# Patient Record
Sex: Female | Born: 1995 | Race: White | Hispanic: No | Marital: Single | Smoking: Never smoker
Health system: Southern US, Community
[De-identification: ages and names within clinical notes are randomized; demographics above are authoritative.]

## PROBLEM LIST (undated history)

## (undated) DIAGNOSIS — F419 Anxiety disorder, unspecified: Secondary | ICD-10-CM

## (undated) DIAGNOSIS — R569 Unspecified convulsions: Secondary | ICD-10-CM

---
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
## 2017-01-14 ENCOUNTER — Encounter: Payer: Self-pay | Admitting: Emergency Medicine

## 2017-01-14 ENCOUNTER — Emergency Department
Admission: EM | Admit: 2017-01-14 | Discharge: 2017-01-15 | Disposition: A | Discharge status: Other Acute Inpatient Hospital | Payer: Medicaid Other | Attending: Emergency Medicine | Admitting: Emergency Medicine

## 2017-01-14 DIAGNOSIS — X838XXA Intentional self-harm by other specified means, initial encounter: Secondary | ICD-10-CM | POA: Diagnosis not present

## 2017-01-14 DIAGNOSIS — Y999 Unspecified external cause status: Secondary | ICD-10-CM | POA: Insufficient documentation

## 2017-01-14 DIAGNOSIS — J45909 Unspecified asthma, uncomplicated: Secondary | ICD-10-CM | POA: Insufficient documentation

## 2017-01-14 DIAGNOSIS — F332 Major depressive disorder, recurrent severe without psychotic features: Secondary | ICD-10-CM | POA: Diagnosis not present

## 2017-01-14 DIAGNOSIS — Y929 Unspecified place or not applicable: Secondary | ICD-10-CM | POA: Insufficient documentation

## 2017-01-14 DIAGNOSIS — T604X2A Toxic effect of rodenticides, intentional self-harm, initial encounter: Secondary | ICD-10-CM | POA: Diagnosis present

## 2017-01-14 DIAGNOSIS — Y939 Activity, unspecified: Secondary | ICD-10-CM | POA: Insufficient documentation

## 2017-01-14 DIAGNOSIS — T1491XA Suicide attempt, initial encounter: Secondary | ICD-10-CM | POA: Diagnosis not present

## 2017-01-14 DIAGNOSIS — Z79899 Other long term (current) drug therapy: Secondary | ICD-10-CM | POA: Insufficient documentation

## 2017-01-14 DIAGNOSIS — F3160 Bipolar disorder, current episode mixed, unspecified: Secondary | ICD-10-CM | POA: Diagnosis not present

## 2017-01-14 DIAGNOSIS — F319 Bipolar disorder, unspecified: Secondary | ICD-10-CM

## 2017-01-14 HISTORY — DX: Unspecified convulsions: R56.9

## 2017-01-14 LAB — ACETAMINOPHEN LEVEL

## 2017-01-14 LAB — URINE DRUG SCREEN, QUALITATIVE (ARMC ONLY)
Amphetamines, Ur Screen: NOT DETECTED
BENZODIAZEPINE, UR SCRN: POSITIVE — AB
Barbiturates, Ur Screen: NOT DETECTED
CANNABINOID 50 NG, UR ~~LOC~~: POSITIVE — AB
Cocaine Metabolite,Ur ~~LOC~~: NOT DETECTED
MDMA (Ecstasy)Ur Screen: NOT DETECTED
Methadone Scn, Ur: NOT DETECTED
Opiate, Ur Screen: POSITIVE — AB
PHENCYCLIDINE (PCP) UR S: NOT DETECTED
Tricyclic, Ur Screen: NOT DETECTED

## 2017-01-14 LAB — PROTIME-INR
INR: 1
Prothrombin Time: 13.1 seconds (ref 11.4–15.2)

## 2017-01-14 LAB — COMPREHENSIVE METABOLIC PANEL
ALK PHOS: 51 U/L (ref 38–126)
ALT: 12 U/L — ABNORMAL LOW (ref 14–54)
AST: 20 U/L (ref 15–41)
Albumin: 4.4 g/dL (ref 3.5–5.0)
Anion gap: 10 (ref 5–15)
BILIRUBIN TOTAL: 0.4 mg/dL (ref 0.3–1.2)
BUN: 17 mg/dL (ref 6–20)
CO2: 20 mmol/L — ABNORMAL LOW (ref 22–32)
Calcium: 9.1 mg/dL (ref 8.9–10.3)
Chloride: 113 mmol/L — ABNORMAL HIGH (ref 101–111)
Creatinine, Ser: 0.82 mg/dL (ref 0.44–1.00)
GFR calc non Af Amer: >60 mL/min (ref >60)
Glucose, Bld: 92 mg/dL (ref 65–99)
Potassium: 3.5 mmol/L (ref 3.5–5.1)
Sodium: 143 mmol/L (ref 135–145)
TOTAL PROTEIN: 7.5 g/dL (ref 6.5–8.1)

## 2017-01-14 LAB — CBC WITH DIFFERENTIAL/PLATELET
BASOS ABS: 0.1 10*3/uL (ref 0–0.1)
Basophils Relative: 1 %
EOS ABS: 0.1 10*3/uL (ref 0–0.7)
EOS PCT: 2 %
HCT: 42.5 % (ref 35.0–47.0)
Hemoglobin: 14.6 g/dL (ref 12.0–16.0)
Lymphocytes Relative: 38 %
Lymphs Abs: 2.4 10*3/uL (ref 1.0–3.6)
MCH: 31.5 pg (ref 26.0–34.0)
MCHC: 34.5 g/dL (ref 32.0–36.0)
MCV: 91.3 fL (ref 80.0–100.0)
Monocytes Absolute: 0.3 10*3/uL (ref 0.2–0.9)
Monocytes Relative: 5 %
Neutro Abs: 3.4 10*3/uL (ref 1.4–6.5)
Neutrophils Relative %: 54 %
PLATELETS: 319 10*3/uL (ref 150–440)
RBC: 4.65 MIL/uL (ref 3.80–5.20)
RDW: 13.5 % (ref 11.5–14.5)
WBC: 6.3 10*3/uL (ref 3.6–11.0)

## 2017-01-14 LAB — HCG, QUANTITATIVE, PREGNANCY

## 2017-01-14 LAB — SALICYLATE LEVEL

## 2017-01-14 LAB — ETHANOL: ALCOHOL ETHYL (B): 276 mg/dL — AB (ref <10)

## 2017-01-14 MED ORDER — LORAZEPAM 2 MG/ML IJ SOLN
1.0000 mg | Freq: Once | INTRAMUSCULAR | Status: AC
  Administered 2017-01-14: 1 mg via INTRAVENOUS

## 2017-01-14 MED ORDER — HALOPERIDOL LACTATE 5 MG/ML IJ SOLN
5.0000 mg | Freq: Once | INTRAMUSCULAR | Status: AC
Start: 2017-01-14 — End: 2017-01-14
  Administered 2017-01-14: 5 mg via INTRAVENOUS

## 2017-01-14 NOTE — ED Provider Notes (Addendum)
[facility] Emergency Department Provider Note ____________________________________________   First MD Initiated Contact with Patient 01/14/17 1931     (approximate)  I have reviewed the triage vital signs and the nursing notes.   HISTORY  Chief Complaint Ingestion    HPI Lindsey Casey is a 21 y.o. female with a past medical history of asthma, seizure d/o and anxiety who presents with an apparent suicidal attempt - patient states she has Casey getting increasingly sad over the last few days.  She states she went to her church where both her father and grandfather (who recently died) prayed, and then she took a mouse repellant mixed in water in order to try to kill herself.  Patient states she does not want to hurt herself right now.  She reports drinking alcohol but denies any other co-ingestants.  She denies any pain, injury or other acute symptoms currently.   Past Medical History:  Diagnosis Date  . Anxiety   . Asthma   . Seizures [facility])     Patient Active Problem List   Diagnosis Date Noted  . Leukocytosis 02/03/2016  . Seizure (HCC) 02/03/2016  . Anxiety 02/03/2016  . Asthma 02/03/2016  . 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 No pertinent surgical history.  Prior to Admission medications   Medication Sig Start Date End Date Taking? Authorizing Provider  levETIRAcetam (KEPPRA) 500 MG tablet Take 1 tablet (500 mg total) by mouth 2 (two) times daily. 02/05/16   Marinda Elk, MD  loratadine (CLARITIN) 10 MG tablet Take 10 mg by mouth daily.    [provider]    Allergies Other and Focalin [dexmethylphenidate hydrochloride]  History reviewed. No pertinent family history.  Social History Social History  Substance Use Topics  . Smoking status: Never Smoker  . Smokeless tobacco: Never Used  . Alcohol use No    Review of Systems  Constitutional: No fever. Eyes: No redness. ENT:  No sore throat. Cardiovascular: Denies chest pain. Respiratory: Denies shortness of breath. Gastrointestinal: No nausea, no vomiting.   Genitourinary: Negative for flank pain.  Musculoskeletal: Negative for back pain. Skin: Negative for rash. Neurological: Negative for headache.   ____________________________________________   PHYSICAL EXAM:  VITAL SIGNS: ED Triage Vitals  Enc Vitals Group     BP 01/14/17 1927 102/87     Pulse Rate 01/14/17 1927 (!) 120     Resp 01/14/17 1927 17     Temp --      Temp src --      SpO2 01/14/17 1927 97 %     Weight 01/14/17 1937 120 lb (54.4 kg)     Height 01/14/17 1937 5\' 4"  (1.626 m)     Head Circumference --      Peak Flow --      Pain Score --      Pain Loc --      Pain Edu? --      Excl. in GC? --     Constitutional: Alert and oriented. Tearful.  Relatively comfortable appearing and in no acute distress. Eyes: Conjunctivae are normal.  EOMI.  PERRLA.  Head: Atraumatic. Nose: No congestion/rhinnorhea. Mouth/Throat: Mucous membranes are moist.   Neck: Normal range of motion.  Cardiovascular: Tachycardic, regular rhythm. Grossly normal heart sounds.  Good peripheral circulation. Respiratory: Normal respiratory effort.  No retractions. Lungs CTAB. Gastrointestinal: Soft and nontender. No distention.  Genitourinary: No flank tenderness. Musculoskeletal: No lower extremity edema.  Extremities warm and  well perfused.  Neurologic:  Normal speech and language.  Motor intact in all extremities.  No gross focal neurologic deficits are appreciated.  Skin:  Skin is warm and dry. No rash noted. Psychiatric: Depressed mood. Speech and behavior are normal.  ____________________________________________   LABS (all labs ordered are listed, but only abnormal results are displayed)  Labs Reviewed  COMPREHENSIVE METABOLIC PANEL - Abnormal; Notable for the following:       Result Value   Chloride 113 (*)    CO2 20 (*)    ALT 12 (*)    All  other components within normal limits  ACETAMINOPHEN LEVEL - Abnormal; Notable for the following:    Acetaminophen (Tylenol), Serum <10 (*)    All other components within normal limits  ETHANOL - Abnormal; Notable for the following:    Alcohol, Ethyl (B) 276 (*)    All other components within normal limits  URINE DRUG SCREEN, QUALITATIVE ([facility] ONLY) - Abnormal; Notable for the following:    Opiate, Ur Screen POSITIVE (*)    Cannabinoid 50 Ng, Ur [place] POSITIVE (*)    Benzodiazepine, Ur Scrn POSITIVE (*)    All other components within normal limits  SALICYLATE LEVEL  CBC WITH DIFFERENTIAL/PLATELET  HCG, QUANTITATIVE, PREGNANCY  PROTIME-INR  POC URINE PREG, ED  CBG MONITORING, ED   ____________________________________________  EKG  ED ECG REPORT I, Dionne BucySebastian Reagyn Facemire, the attending physician, personally viewed and interpreted this ECG.  Date: 01/14/2017 EKG Time: 1934 Rate: 107 Rhythm: sinus tachycardia QRS Axis: normal Intervals: normal ST/T Wave abnormalities: normal Narrative Interpretation: no evidence of acute ischemia  ____________________________________________  RADIOLOGY    ____________________________________________   PROCEDURES  Procedure(s) performed: No    Critical Care performed: No ____________________________________________   INITIAL IMPRESSION / ASSESSMENT AND PLAN / ED COURSE  Pertinent labs & imaging results that were available during my care of the patient were reviewed by me and considered in my medical decision making (see chart for details).  21 year old female with past medical history as noted presents with apparent suicide/self harm attempt.  Patient is tachycardic at triage but other VS normal, and exam otherwise unremarkable.  No trauma.  Patient is now calm and cooperative.    I discussed case with Poison Control - the mouse repellant patient ingested has the following active ingredients: mint oil, rosemary oil, and cedar oil.  It  has the following inactive ingredients: ca carbonate, hydrogenated soybean oil.  Poison Control recs are supportive care and evaluation for coingestants- no specific intervention indicated.  Plan for tox lab workup, [facility], and reassess.  Patient involuntarily committed due to acute danger to self and others.  ----------------------------------------- 9:05 PM on 01/14/2017 -----------------------------------------  Patient ran out of the room and attempted to escape the ED.  She was restrained by staff, and then began attempting to hit and kick staff members.  Patient was brought to the ground and was not injured.  We were unable to verbally redirect; due to acute danger to self and others, patient sedated and placed in 4-point restraints pending effects of the sedation.  Will continue to monitor closely.  Restraints will be reassessed and removed as soon as it is safe.    ----------------------------------------- 8:21 AM on 01/20/2017 -----------------------------------------   Behavioral Restraint Provider Note:  Behavioral Indicators: Danger to self and others; violent behavior.   Reaction to intervention: Resisting  Review of systems: No changes  History: H&P and lab results reviewed.   Mental Status Exam: Alert, agitated.  Restraint Continuation: Continue until patient adequately sedated.   Restraint Rationale Continuation: Acute danger to self and others.      ----------------------------------------- 11:37 PM on 01/14/2017 -----------------------------------------  Patient sleeping comfortably.  Signed out to Dr. Manson Passey.  When awake, [facility] consult and dispo per [facility] recs.  Medical workup is negative.    ____________________________________________   FINAL CLINICAL IMPRESSION(S) / ED DIAGNOSES  Final diagnoses:  Suicidal behavior with attempted self-injury (HCC)      NEW MEDICATIONS STARTED DURING THIS VISIT:  New Prescriptions   No medications on  file     Note:  This document was prepared using Dragon voice recognition software and may include unintentional dictation errors.   Dionne Bucy, MD 01/14/17 [id number]    Dionne Bucy, MD 01/20/17 [phone]

## 2017-01-14 NOTE — ED Triage Notes (Signed)
Pt bib [facility] for suicide attempt by eating organic rat poison and drinking alcohol. Pt states recent life changes furthering depression, not on medications at this time d/t lack of insurance. Pt states taking 2 teaspoons of rat poison and drinking 2 twisted teas, 1 glass of wine, and 1 [place] iced tea.   Pt states father was killed by police approx 1 year ago, birthday was [birth year] 31, the same day pt found boyfriend cheating. [place] [employer] [place] and that is when attempted to kill herself to visit father.    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 Not taking meds at this time.

## 2017-01-14 NOTE — ED Notes (Signed)
Patient IVC/SOC has been called/Awaiting telepsych consult.

## 2017-01-14 NOTE — ED Notes (Addendum)
Pt black jeans, maroon underwear, pink bra, black floral shirt and black shoes placed in belongings bag placed at nurses station by this RN. Also 2 black hair ties and 1 purple/brown bracelet.   Pt dressed out into behavioral clothes by this RN and Mayra, EDT

## 2017-01-14 NOTE — BH Assessment (Signed)
Assessment Note  [name] is an 21 y.o. female. The pt came in after overdosing on rat poison.  She reported she was thinking about her deceased father, who passed away October 29 when the pt was 13.  She stated she also found out her bf was cheating on her October 29 this year.  The pt had multiple drinks earlier today.  She stated she had 2 glasses of wine, a [place] iced tea.  She mixed the rat poison with 2 malt beverages.  Her blood alcohol level was 276.  She stated the desire to kill herself is something she started to think about today after she started drinking.  She states she does not want to die now.  The patient had another suicide attempt in July when she jumped off of a bridge.  She was hospitalized at [facility] in July.  [place] [facility] [place].  She reported due to a lack of finances, she has not taken her medication for the last 3 weeks.  She stated she still had a good appetite, but her sleep is erratic, with her sleeping to much at times and not enough at other times.  The pt reported she has a history of using cocaine, marijuana, and xanax.  She stated she uses marijuana and xanax daily.  She stated she uses cocaine occasionally and has not used it in 2 weeks.  She reported she uses alcohol only on special occasions.  The pt UDS was positive for marijuana, benzodiazepine (xxanax) and opiate.  The pt denied abusing pain pills or using heroin.  She denies HI and psychosis.   Diagnosis: Major Depressive Disorder, Severe  Past Medical History:  Past Medical History:  Diagnosis Date  . Anxiety   . Asthma   . Seizures (HCC)     History reviewed. No pertinent surgical history.  Family History: History reviewed. No pertinent family history.  Social History:  reports that she has never smoked. She has never used smokeless tobacco. She reports that she uses drugs, including Marijuana, about 3 times per week. She reports that she does not drink  alcohol.  Additional Social History:  Alcohol / Drug Use Pain Medications: See PTA Prescriptions: See PTA Over the Counter: See PTA History of alcohol / drug use?: Yes Substance #1 Name of Substance 1: marijuanna 1 - Age of First Use: 13 1 - Frequency: daily 1 - Last Use / Amount: 01/13/2017 Substance #2 Name of Substance 2: cocaine 2 - Age of First Use: "can't remember" 2 - Frequency: once a month 2 - Duration: "can't remember" 2 - Last Use / Amount: 2 weeks ago Substance #3 Name of Substance 3: alcohol 3 - Frequency: "special occassions" 3 - Last Use / Amount: today Substance #4 Name of Substance 4: Xanax 4 - Amount (size/oz): "one pill" 4 - Last Use / Amount: last week  CIWA: CIWA-Ar BP: 102/87 Pulse Rate: (!) 120 COWS:    Allergies:  Allergies  Allergen Reactions  . Other     Heat - leads to hives  . Focalin [Dexmethylphenidate Hydrochloride] Rash    Major rash with blistering    Home Medications:  (Not in a hospital admission)  OB/GYN Status:  Patient's last menstrual period was 01/10/2017.  General Assessment Data Location of Assessment[facility] ED TTS Assessment: In system Is this a Tele or Face-to-Face Assessment?: Face-to-Face Is this an Initial Assessment or a Re-assessment for this encounter?: Initial Assessment Marital status: Single  Maiden name: NA Is patient pregnant?: No Pregnancy Status: No Living Arrangements: Parent Can pt return to current living arrangement?: Yes Admission Status: Involuntary Is patient capable of signing voluntary admission?: No Referral Source: Self/Family/Friend Insurance type:  (none)     Crisis Care Plan Living Arrangements: Parent Name of Psychiatrist: [facility] Psychiatrist Name of Therapist: [facility] counselor  Education Status Is patient currently in school?: No Current Grade: NA Highest grade of school patient has completed: high school diploma Name of school: NA Contact person: NA  Risk to self with the past  6 months Suicidal Ideation: Yes-Currently Present Has patient been a risk to self within the past 6 months prior to admission? : Yes Suicidal Intent: Yes-Currently Present Has patient had any suicidal intent within the past 6 months prior to admission? : Yes Is patient at risk for suicide?: Yes Suicidal Plan?: No-Not Currently/Within Last 6 Months Has patient had any suicidal plan within the past 6 months prior to admission? : Yes Access to Means: Yes Specify Access to Suicidal Means: took rat poison What has been your use of drugs/alcohol within the last 12 months?: had alcohol today, uses cocaine, marijuana and xanax Previous Attempts/Gestures: Yes How many times?: 2 Other Self Harm Risks: none Triggers for Past Attempts: Anniversary, Other (Comment) (break up from BF and court dates) Intentional Self Injurious Behavior: None Family Suicide History: Unknown Recent stressful life event(s): Conflict (Comment), Loss (Comment) (conflict with bf and anniversary of father's death.) Persecutory voices/beliefs?: No Depression: Yes Depression Symptoms: Feeling worthless/self pity Substance abuse history and/or treatment for substance abuse?: Yes Suicide prevention information given to non-admitted patients: Not applicable  Risk to Others within the past 6 months Homicidal Ideation: No Does patient have any lifetime risk of violence toward others beyond the six months prior to admission? : No Thoughts of Harm to Others: No Current Homicidal Intent: No Current Homicidal Plan: No Access to Homicidal Means: No Identified Victim: NA History of harm to others?: No Assessment of Violence: None Noted Violent Behavior Description: none Does patient have access to weapons?: No Criminal Charges Pending?: No Does patient have a court date: No Is patient on probation?: No  Psychosis Hallucinations: None noted Delusions: None noted  Mental Status Report Appearance/Hygiene: In scrubs,  Unremarkable Eye Contact: Good Motor Activity: Unremarkable, Freedom of movement Speech: Logical/coherent Level of Consciousness: Alert Mood: Depressed Affect: Appropriate to circumstance Anxiety Level: Minimal Thought Processes: Coherent, Relevant Judgement: Impaired Orientation: Person, Time, Place, Situation, Appropriate for developmental age Obsessive Compulsive Thoughts/Behaviors: None  Cognitive Functioning Concentration: Decreased Memory: Recent Intact, Remote Intact IQ: Average Insight: Poor Impulse Control: Poor Appetite: Good Weight Loss: 0 Weight Gain: 0 Sleep: Increased Total Hours of Sleep: 10 Vegetative Symptoms: None  ADLScreening [facility] Assessment Services) Patient's cognitive ability adequate to safely complete daily activities?: Yes Patient able to express need for assistance with ADLs?: Yes Independently performs ADLs?: Yes (appropriate for developmental age)  Prior Inpatient Therapy Prior Inpatient Therapy: Yes Prior Therapy Dates: multiple most recent summer 2018 Prior Therapy Facilty/Provider(s): [place], [place], and [place] Reason for Treatment: SI  Prior Outpatient Therapy Prior Outpatient Therapy: Yes Prior Therapy Dates: current Prior Therapy Facilty/Provider(s): [facility] Reason for Treatment: depression Does patient have an ACCT team?: No Does patient have Intensive In-House Services?  : No Does patient have [facility] services? : No Does patient have P4CC services?: No  ADL Screening (condition at time of admission) Patient's cognitive ability adequate to safely complete daily activities?: Yes Is the patient deaf or have  difficulty hearing?: No Does the patient have difficulty seeing, even when wearing glasses/contacts?: No Does the patient have difficulty concentrating, remembering, or making decisions?: No Patient able to express need for assistance with ADLs?: Yes Does the patient have difficulty dressing or bathing?: No Independently  performs ADLs?: Yes (appropriate for developmental age) Does the patient have difficulty walking or climbing stairs?: No Weakness of Legs: None Weakness of Arms/Hands: None  Home Assistive Devices/Equipment Home Assistive Devices/Equipment: None  Therapy Consults (therapy consults require a physician order) PT Evaluation Needed: No OT Evalulation Needed: No SLP Evaluation Needed: No Abuse/Neglect Assessment (Assessment to be complete while patient is alone) Physical Abuse: Denies Verbal Abuse: Denies Sexual Abuse: Denies Exploitation of patient/patient's resources: Denies Self-Neglect: Denies Values / Beliefs Cultural Requests During Hospitalization: None Spiritual Requests During Hospitalization: None Consults Spiritual Care Consult Needed: No Social Work Consult Needed: No Merchant navy officer (For Healthcare) Does Patient Have a Medical Advance Directive?: No    Additional Information 1:1 In Past 12 Months?: No CIRT Risk: No Elopement Risk: No Does patient have medical clearance?: Yes     Disposition:  Disposition Initial Assessment Completed for this Encounter: Yes Disposition of Patient: Referred to [facility])  On Site Evaluation by:   Reviewed with Physician:    Ottis Stain 01/14/2017 8:47 PM
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
## 2017-01-15 ENCOUNTER — Encounter: Payer: Self-pay | Admitting: Psychiatry

## 2017-01-15 ENCOUNTER — Inpatient Hospital Stay
Admission: AD | Admit: 2017-01-15 | Discharge: 2017-01-21 | DRG: 885 | Disposition: A | Discharge status: Home / Self Care | Payer: No Typology Code available for payment source | Attending: Psychiatry | Admitting: Psychiatry

## 2017-01-15 DIAGNOSIS — R569 Unspecified convulsions: Secondary | ICD-10-CM | POA: Diagnosis present

## 2017-01-15 DIAGNOSIS — Y908 Blood alcohol level of 240 mg/100 ml or more: Secondary | ICD-10-CM | POA: Diagnosis present

## 2017-01-15 DIAGNOSIS — Z888 Allergy status to other drugs, medicaments and biological substances status: Secondary | ICD-10-CM | POA: Diagnosis not present

## 2017-01-15 DIAGNOSIS — Z79899 Other long term (current) drug therapy: Secondary | ICD-10-CM

## 2017-01-15 DIAGNOSIS — F39 Unspecified mood [affective] disorder: Principal | ICD-10-CM

## 2017-01-15 DIAGNOSIS — Z23 Encounter for immunization: Secondary | ICD-10-CM | POA: Diagnosis not present

## 2017-01-15 DIAGNOSIS — F419 Anxiety disorder, unspecified: Secondary | ICD-10-CM | POA: Diagnosis present

## 2017-01-15 DIAGNOSIS — F603 Borderline personality disorder: Secondary | ICD-10-CM | POA: Diagnosis present

## 2017-01-15 DIAGNOSIS — F319 Bipolar disorder, unspecified: Secondary | ICD-10-CM

## 2017-01-15 DIAGNOSIS — T604X2A Toxic effect of rodenticides, intentional self-harm, initial encounter: Secondary | ICD-10-CM | POA: Diagnosis not present

## 2017-01-15 DIAGNOSIS — F1010 Alcohol abuse, uncomplicated: Secondary | ICD-10-CM | POA: Diagnosis present

## 2017-01-15 DIAGNOSIS — Z915 Personal history of self-harm: Secondary | ICD-10-CM

## 2017-01-15 DIAGNOSIS — F1310 Sedative, hypnotic or anxiolytic abuse, uncomplicated: Secondary | ICD-10-CM | POA: Diagnosis present

## 2017-01-15 DIAGNOSIS — F3160 Bipolar disorder, current episode mixed, unspecified: Secondary | ICD-10-CM | POA: Diagnosis not present

## 2017-01-15 LAB — RAPID HIV SCREEN (HIV 1/2 AB+AG)
HIV 1/2 ANTIBODIES: NONREACTIVE
HIV-1 P24 Antigen - HIV24: NONREACTIVE

## 2017-01-15 MED ORDER — MAGNESIUM HYDROXIDE 400 MG/5ML PO SUSP: 30.0000 mL | Freq: Every day | ORAL | Status: DC | PRN

## 2017-01-15 MED ORDER — HALOPERIDOL 5 MG PO TABS
5.0000 mg | ORAL_TABLET | Freq: Four times a day (QID) | ORAL | Status: DC | PRN
Start: 2017-01-15 — End: 2017-01-21
  Administered 2017-01-19: 5 mg via ORAL
  Filled 2017-01-15: qty 1

## 2017-01-15 MED ORDER — LEVETIRACETAM 500 MG PO TABS
500.0000 mg | ORAL_TABLET | Freq: Two times a day (BID) | ORAL | Status: DC
  Administered 2017-01-15 – 2017-01-21 (×12): 500 mg via ORAL
  Filled 2017-01-15 (×12): qty 1

## 2017-01-15 MED ORDER — ALUM & MAG HYDROXIDE-SIMETH 200-200-20 MG/5ML PO SUSP: 30.0000 mL | ORAL | Status: DC | PRN

## 2017-01-15 MED ORDER — LITHIUM CARBONATE ER 300 MG PO TBCR
300.0000 mg | EXTENDED_RELEASE_TABLET | Freq: Two times a day (BID) | ORAL | Status: DC
  Administered 2017-01-15 – 2017-01-21 (×12): 300 mg via ORAL
  Filled 2017-01-15 (×13): qty 1

## 2017-01-15 MED ORDER — LOPERAMIDE HCL 2 MG PO CAPS: 2.0000 mg | ORAL_CAPSULE | ORAL | Status: AC | PRN

## 2017-01-15 MED ORDER — HYDROXYZINE HCL 25 MG PO TABS
25.0000 mg | ORAL_TABLET | Freq: Four times a day (QID) | ORAL | Status: AC | PRN
Start: 2017-01-15 — End: 2017-01-18
  Administered 2017-01-17: 25 mg via ORAL
  Filled 2017-01-15: qty 1

## 2017-01-15 MED ORDER — HALOPERIDOL LACTATE 5 MG/ML IJ SOLN: 5.0000 mg | Freq: Four times a day (QID) | INTRAMUSCULAR | Status: DC | PRN

## 2017-01-15 MED ORDER — ACETAMINOPHEN 325 MG PO TABS
650.0000 mg | ORAL_TABLET | Freq: Four times a day (QID) | ORAL | Status: DC | PRN
  Administered 2017-01-19: 650 mg via ORAL
  Filled 2017-01-15: qty 2

## 2017-01-15 MED ORDER — ONDANSETRON 4 MG PO TBDP: 4.0000 mg | ORAL_TABLET | Freq: Four times a day (QID) | ORAL | Status: AC | PRN

## 2017-01-15 MED ORDER — GABAPENTIN 600 MG PO TABS
600.0000 mg | ORAL_TABLET | Freq: Three times a day (TID) | ORAL | Status: DC
  Administered 2017-01-16 – 2017-01-20 (×13): 600 mg via ORAL
  Filled 2017-01-15 (×13): qty 1

## 2017-01-15 MED ORDER — CHLORDIAZEPOXIDE HCL 25 MG PO CAPS
25.0000 mg | ORAL_CAPSULE | Freq: Four times a day (QID) | ORAL | Status: AC | PRN
  Administered 2017-01-15: 25 mg via ORAL
  Filled 2017-01-15: qty 1

## 2017-01-15 MED ORDER — LEVETIRACETAM 500 MG PO TABS
500.0000 mg | ORAL_TABLET | Freq: Two times a day (BID) | ORAL | Status: DC
  Administered 2017-01-15: 500 mg via ORAL
  Filled 2017-01-15 (×3): qty 1

## 2017-01-15 MED ORDER — HYDROXYZINE HCL 25 MG PO TABS: 25.0000 mg | ORAL_TABLET | Freq: Four times a day (QID) | ORAL | Status: DC | PRN

## 2017-01-15 MED ORDER — GABAPENTIN 600 MG PO TABS
600.0000 mg | ORAL_TABLET | Freq: Three times a day (TID) | ORAL | Status: DC
  Administered 2017-01-15: 600 mg via ORAL
  Filled 2017-01-15: qty 1

## 2017-01-15 MED ORDER — CHLORDIAZEPOXIDE HCL 25 MG PO CAPS: 25.0000 mg | ORAL_CAPSULE | Freq: Four times a day (QID) | ORAL | Status: DC | PRN

## 2017-01-15 NOTE — ED Notes (Addendum)
This RN escort pt mom to room for visit. 1:1 remain in pt room with this RN, pt, and pt mother during visit. Pt began talking about money and not affording this visit to ER and began yelling at mother regarding finances. Pt screaming at mother to "GET THE FUCK OUT". Mother told pt she loved her and this RN walked mother back out to the lobby with the waiting family. 1:1 sitter remained in room with patient.

## 2017-01-15 NOTE — ED Notes (Signed)
Pt sleeping on stretcher.  Given several cups of water.  Pt is very cooperative, states she is very embarrassed of her behavior earlier and agrees to be cooperative until discharge
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
## 2017-01-15 NOTE — ED Notes (Signed)
Pt transferred over via tech and Emergency planning/management officerpolice officer. Pt was in paper scrubs. Pt was wanded. Pt showed rules and regulation of unit. Pt offer fluids. Pt given an extra blanket. Pt has fake blood up under her chin and neck. Pt is on Q15 mins safety rounds. Will cont to monitor pt.

## 2017-01-15 NOTE — ED Notes (Signed)
Patient observed lying in bed with eyes closed  Even, unlabored respirations observed   NAD pt appears to be sleeping  I will continue to monitor along with every 15 minute visual observations and ongoing security monitoring    

## 2017-01-15 NOTE — ED Notes (Signed)

## 2017-01-15 NOTE — ED Notes (Signed)
Sisters number [phone]

## 2017-01-15 NOTE — BH Assessment (Signed)
Pt to be admitted to BMU per Dr.McNew. Pt pending bed assignment.
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
## 2017-01-15 NOTE — BH Assessment (Addendum)
[facility] by Dr. Mikey CollegeMcKnew.  Attending Physician will be Dr. Mikey CollegeMcKnew.   Patient has been assigned to room 313, [facility] Charge Nurse SidneyPhyllis.   Intake Paper Work has been signed and placed on patient chart.  ER staff is aware of the admission (Dr. Lenard LancePaduchowski, ER MD; Vic RipperAmy B., Patient's Nurse & Mertie ClauseJeanelle, Patient Access).

## 2017-01-15 NOTE — BH Assessment (Signed)
Dr.Kumar requests SOC be discontinued and psych consult with Dr.Clapacs be entered. EDP Dr.Norman informed.

## 2017-01-15 NOTE — ED Notes (Signed)
Pt awakened  - vital sign taken - pt educated [facility]  - psych consult pending   IV removed  Breakfast provided

## 2017-01-15 NOTE — ED Notes (Signed)
Serum glucose 92 upon arrival
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
## 2017-01-15 NOTE — ED Notes (Signed)
[facility] Pt available for transfer after 8 pm.
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
## 2017-01-15 NOTE — ED Notes (Addendum)
1:1 Sitter placed at bedside per this charge RN

## 2017-01-16 DIAGNOSIS — F39 Unspecified mood [affective] disorder: Principal | ICD-10-CM

## 2017-01-16 LAB — LIPID PANEL
Cholesterol: 176 mg/dL (ref 0–200)
HDL: 50 mg/dL (ref >40)
LDL Cholesterol: 99 mg/dL (ref 0–99)
Total CHOL/HDL Ratio: 3.5 RATIO
Triglycerides: 135 mg/dL (ref <150)
VLDL: 27 mg/dL (ref 0–40)

## 2017-01-16 LAB — HEMOGLOBIN A1C
HEMOGLOBIN A1C: 5.1 % (ref 4.8–5.6)
Mean Plasma Glucose: 99.67 mg/dL

## 2017-01-16 LAB — TSH: TSH: 1.163 u[IU]/mL (ref 0.350–4.500)

## 2017-01-16 NOTE — Progress Notes (Signed)
Patient has been in room reading. Upon assessment, it is noted that patient's mood is improving. Has been reading calmly. She is pleasant and cooperative and maintains a positive attitude. Presented to the medication room and took her medication voluntarily. No sign of distress. Currently in group, alert and oriented. Safety precautions maintained.

## 2017-01-16 NOTE — Plan of Care (Signed)
Problem: Coping: Goal: Ability to cope will improve Outcome: Progressing Emotional state improving: pt able to engage in a conversation pleasantly, motivated
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
## 2017-01-16 NOTE — Plan of Care (Signed)
Problem: Coping: Goal: Ability to cope will improve Outcome: Progressing Support and encouragements provided

## 2017-01-16 NOTE — Plan of Care (Signed)
Problem: Coping: Goal: Ability to identify and develop effective coping behavior will improve Outcome: Not Progressing Newly admitted

## 2017-01-16 NOTE — Progress Notes (Signed)
Recreation Therapy Notes  Date:11.02.18  Time:9:30 am  Location:Craft Room  Behavioral response:N/A  Intervention Topic:Stress  Discussion/Intervention: Patient did not attend group.  Clinical Observations/Feedback:  Patient did not attend group.  Pressley Tadesse LRT/CTRS         Casy Brunetto 01/16/2017 12:00 PM 

## 2017-01-16 NOTE — Progress Notes (Signed)
Recreation Therapy Notes  INPATIENT RECREATION THERAPY ASSESSMENT  Patient Details Name: Lindsey Casey MRN: [id number] DOB: 10/04/1995 Today's Date: 01/16/2017  Patient Stressors: Family, Relationship  Patient states that she tried to hurt herself because she felt depressed.  Coping Skills:   Other (Comment) (counting)  Personal Challenges: Anger, Communication, Relationships, Self-Esteem/Confidence, Stress Management, Substance Abuse, Time Management, Trusting Others  Leisure Interests (2+):  Individual - TV, Social - Family, Individual - Reading (play with dog)  Awareness of Community Resources:  Yes  Community Resources:   None  Current Use: No  If no, Barriers?: Other (Comment) (Not intrested)  Patient Strengths:  Humor  Patient Identified Areas of Improvement:  [place]  Current Recreation Participation:  Playing with dog  Patient Goal for Hospitalization:  Working on Corporate investment bankercoping skills  [place]  County [place] Residence[place]  [place]   Current [place] (including self-harm):  No  Current HI:  No  Consent to Intern Participation: N/A   Burle Kwan 01/16/2017, 2:32 PM

## 2017-01-16 NOTE — Plan of Care (Signed)
Problem: Medication: Goal: Compliance with prescribed medication regimen will improve Outcome: Progressing Medication compliant   

## 2017-01-16 NOTE — Plan of Care (Signed)
Problem: Education: Goal: Ability to state activities that reduce stress will improve Outcome: Not Progressing Newly admitted. Anxious, sad and depressed

## 2017-01-16 NOTE — Progress Notes (Signed)
Recreation Therapy Notes  Date: 11.02.18  Time: 3:00pm  Location: Craft room  Behavioral response: Did not attend  Group Type: Art/Craft  Participation level: Did not attendd  Communication: Patient did not attend group.  Comments: N/A  Sargent Mankey LRT/CTRS        Jabbar Palmero 01/16/2017 4:29 PM

## 2017-01-16 NOTE — Plan of Care (Signed)
Problem: Education: Goal: Knowledge of disease or condition will improve Outcome: Progressing Educated about withdrawal symptoms. Verbalized understanding
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
## 2017-01-16 NOTE — Plan of Care (Signed)
Problem: Education: Goal: Knowledge of disease or condition will improve Outcome: Progressing Able to report symptoms. No withdrawal symptoms noted

## 2017-01-16 NOTE — BHH Counselor (Addendum)
Adult Comprehensive Assessment  Patient ID: Lindsey Casey, female   DOB: 12/13/1995, 21 y.o.   MRN: [id number]  Information Source: Information source: Patient  Current Stressors:  Educational / Learning stressors: high school graduate Employment / Job issues: works full time at [employer] Family Relationships: supportive family, bio father killed by police when pt was Chief Operating Officer12 Financial / Lack of resources (include bankruptcy): works but does not feel she can afford mental health care as she recently lost her [employer] Housing / Lack of housing: lives w mother and stepfather Physical health (include injuries & life threatening diseases): seizure disorder - "I have a seizure when I dont have my Xanax unless I am also taking Keppra"; concerned that she will miss appt w neurologist on Tues 11/6 where she hoped to get seizure meds refilled Social relationships: "my friends didnt pay attention to me, they wanted to get high instead"; found out boyfriend had cheated on her, terminated relationship Substance abuse: "I smoke a lot", abuses Xanax Bereavement / Loss: father died when she was 89  Living/Environment/Situation:  Living Arrangements: Parent Living conditions (as described by patient or guardian): lives w parents and brother How long has patient lived in current situation?: lifetime What is atmosphere in current home: Supportive  Family History:  Marital status: Single Are you sexually active?: Yes What is your sexual orientation?: heterosexual Has your sexual activity been affected by drugs, alcohol, medication, or emotional stress?: unknown Does patient have children?: No  Childhood History:  By whom was/is the patient raised?: Mother/father and step-parent Additional childhood history information: bio father incarcerated when patient was born, released when she was 89; mother and bio father divorced while he was in prison Description of patient's relationship with caregiver when they  were a child: close to mother, intermittent contact w father Patient's description of current relationship with people who raised him/her: father dead, "When I need someone to talk to, I can talk to my mother and my stepfather and brother" How were you disciplined when you got in trouble as a child/adolescent?: unknown Does patient have siblings?: Yes Number of Siblings: 1 Description of patient's current relationship with siblings: brother, [id number], lives at home "I talk to him when I am upset" Did patient suffer any verbal/emotional/physical/sexual abuse as a child?: No Did patient suffer from severe childhood neglect?: No Has patient ever been sexually abused/assaulted/raped as an adolescent or adult?: No Was the patient ever a victim of a crime or a disaster?: No Witnessed domestic violence?: No Has patient been effected by domestic violence as an adult?: No  Education:  Highest grade of school patient has completed: high school diploma - "school was fine until I was in high school, then I stopped going"; skipped school frequently but was able to graduate on time " I am smart" Currently a student?: No Name of school: none Learning disability?: No  Employment/Work Situation:   Employment situation: Employed 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 How long has patient been employed?: several months Patient's job has been impacted by current illness: No What is the longest time patient has a held a job?: unknown Where was the patient employed at that time?: unknown Has patient ever been in the [employer] and Companymilitary?: No Has patient ever served in combat?: No Did You Receive Any Psychiatric Treatment/Services While in Equities traderthe Military?: No Are There Guns or Other Weapons in Your Home?: No  Financial Resources:   Financial resources: Income from employment Does patient have a  representative payee or guardian?: No  Alcohol/Substance Abuse:   What has been your use of  drugs/alcohol within the last 12 months?: abuses cocaine, marijuana, xanax, states she has seizures when she doesnt have Xanax unless she is also taking Keppra; states last use of Xanax was approx one week ago If attempted suicide, did drugs/alcohol play a role in this?: No Alcohol/Substance Abuse Treatment Hx: Denies past history Has alcohol/substance abuse ever caused legal problems?: No  Social Support System:   Conservation officer, nature Support System: Fair Museum/gallery exhibitions officer System: has friends, however did not feel they were supportive when she reached out after learning of boyfriends infidelity Type of faith/religion: unknown How does patient's faith help to cope with current illness?: unknown  Leisure/Recreation:   Leisure and Hobbies: watch TV, likes to play w her dog, talk w family  Strengths/Needs:   What things does the patient do well?: determined, has been consistent w therapy and medications management while she had insuance In what areas does patient struggle / problems for patient: "I dont want to go back to [facility], I dont want to have to pay for something I should get for free"; has been hospitalized 1 -2 times/year since age 52; reports her coping skills are inadequate and feels her medications have not been effective  Discharge Plan:   Does patient have access to transportation?: Yes Will patient be returning to same living situation after discharge?: Yes Currently receiving community mental health services: Yes (From Whom) ([facility]) If no, would patient like referral for services when discharged?: No (currently declining referrals because she doesnt want to have to pay sliding fee scale charge for therapy, meds and meds mgmt) Does patient have financial barriers related to discharge medications?: Yes Patient description of barriers related to discharge medications: lost insurance, feels she cannot afford medications at this time  Summary/Recommendations:   Summary and  Recommendations (to be completed by the evaluator): Patient is a 21 year old female, admitted involuntarily after taking intentional overdose of poison, diagnosed with Bipolar Disorder at admission.  Lives w Lindsey Casey, stepfather and brother; works full time in [employer].  Father died when pt was 18, sudden and unexpected death. Has been hospitalized 1 - 2 times/year since age 13, currently under care at [facility] in [facility].  Does not want to return to this provider because her Medicaid recently terminated and she does not feel she has the funds to pay for treatment.  Abuses Xanax, THC; has seizure disorder and is concerned about missing upcoming neurology appointment.  Goals for hospitalization include stronger coping skills and more effective medication regimen.     Sallee Lange. 01/16/2017

## 2017-01-16 NOTE — [facility]HH Suicide Risk Assessment (Signed)
[facility]   Current Mental Status:   Depressed Loss Factors:   Father passed away at age 21 Historical Factors:   Long history of hospitalizations and suicide attempts Risk Reduction Factors:   Support by mother  Total Time spent with patient: 20 minutes Principal Problem: Unspecified mood (affective) disorder (HCC) Diagnosis:   Patient Active Problem List   Diagnosis Date Noted  . Unspecified mood (affective) disorder (HCC) [F39] 01/16/2017  . Bipolar disorder (HCC) [F31.9] 01/15/2017  . Leukocytosis [D72.829] 02/03/2016  . Seizure (HCC) [R56.9] 02/03/2016  . Anxiety [F41.9] 02/03/2016  . Asthma [J45.909] 02/03/2016  . Benzodiazepine withdrawal with complication [facility](HCC) [F13.239] 02/03/2016   Subjective Data: Pt had recent suicide attempts, long history of mood instability and suicide attempts.   Continued Clinical Symptoms:  Alcohol Use Disorder Identification Test Final Score (AUDIT): 11 The "Alcohol Use Disorders Identification Test", Guidelines for Use in Primary Care, Second Edition.  World Science writerHealth Organization [facility](WHO). Score between 0-7:  no or low risk or alcohol related problems. Score between 8-15:  moderate risk of alcohol related problems. Score between 16-19:  high risk of alcohol related problems. Score 20 or above:  warrants further diagnostic evaluation for alcohol dependence and treatment.   CLINICAL FACTORS:   Depression:   Aggression Comorbid alcohol abuse/dependence Impulsivity   Musculoskeletal: Strength & Muscle Tone: within normal limits Gait & Station: normal Patient leans: N/A  Psychiatric Specialty Exam: Physical Exam  ROS  Blood pressure 102/60, pulse 90, temperature 98.4 F (36.9 C), temperature source Oral, resp. rate 18, height 5\' 2"  (1.575 m), weight 54.4 kg (120 lb), last menstrual period 01/10/2017.Body mass index is 21.95 kg/m.  See Progress note from today.       COGNITIVE FEATURES THAT CONTRIBUTE TO RISK:   Polarized thinking    SUICIDE RISK:   Moderate:  Frequent suicidal ideation with limited intensity, and duration, some specificity in terms of plans, no associated intent, good self-control, limited dysphoria/symptomatology, some risk factors present, and identifiable protective factors, including available and accessible social support.  PLAN OF CARE:  -Start Lithium 300 mg BID  I certify that inpatient services furnished can reasonably be expected to improve the patient's condition.   Haskell RilingHolly R Jossiah Smoak, MD 01/16/2017, 1:33 PM

## 2017-01-16 NOTE — Plan of Care (Signed)
Problem: Education: Goal: Ability to incorporate positive changes in behavior to improve self-esteem will improve Outcome: Progressing Denies thoughts of self harm

## 2017-01-16 NOTE — Plan of Care (Signed)
Problem: Coping: Goal: Ability to verbalize feelings will improve Outcome: Progressing Patient currently able to express her thoughts and concerns

## 2017-01-16 NOTE — Tx Team (Addendum)
Interdisciplinary Treatment and Diagnostic Plan Update  01/16/2017 Time of Session: [place] MRN: [id number]  Principal Diagnosis: <principal problem not specified>  Secondary Diagnoses: Active Problems:   Bipolar disorder ([place])   Current Medications:  Current Facility-Administered Medications  Medication Dose Route Frequency Provider Last Rate Last Dose  . acetaminophen (TYLENOL) tablet 650 mg  650 mg Oral Q6H PRN McNew, Tyson Babinski, MD      . alum & mag hydroxide-simeth (MAALOX/MYLANTA) 200-200-20 MG/5ML suspension 30 mL  30 mL Oral Q4H PRN McNew, Holly R, MD      . chlordiazePOXIDE (LIBRIUM) capsule 25 mg  25 mg Oral Q6H PRN Marylin Crosby, MD   25 mg at 01/15/17 2209  . gabapentin (NEURONTIN) tablet 600 mg  600 mg Oral TID McNew, Tyson Babinski, MD      . haloperidol (HALDOL) tablet 5 mg  5 mg Oral Q6H PRN McNew, Tyson Babinski, MD       Or  . haloperidol lactate (HALDOL) injection 5 mg  5 mg Intramuscular Q6H PRN McNew, Tyson Babinski, MD      . hydrOXYzine (ATARAX/VISTARIL) tablet 25 mg  25 mg Oral Q6H PRN McNew, Holly R, MD      . levETIRAcetam (KEPPRA) tablet 500 mg  500 mg Oral BID Marylin Crosby, MD   500 mg at 01/15/17 2210  . lithium carbonate (LITHOBID) CR tablet 300 mg  300 mg Oral Q12H McNew, Tyson Babinski, MD   300 mg at 01/15/17 2210  . loperamide (IMODIUM) capsule 2-4 mg  2-4 mg Oral PRN McNew, Tyson Babinski, MD      . magnesium hydroxide (MILK OF MAGNESIA) suspension 30 mL  30 mL Oral Daily PRN McNew, Holly R, MD      . ondansetron (ZOFRAN-ODT) disintegrating tablet 4 mg  4 mg Oral Q6H PRN McNew, Tyson Babinski, MD       PTA Medications: Prescriptions Prior to Admission  Medication Sig Dispense Refill Last Dose  . levETIRAcetam (KEPPRA) 500 MG tablet Take 1 tablet (500 mg total) by mouth 2 (two) times daily. 60 tablet 3 Past Month at Unknown time  . loratadine (CLARITIN) 10 MG tablet Take 10 mg by mouth daily.   Past Month at Unknown time    Patient Stressors: Loss of relationship Substance  abuse Other: conflict with boyfriend  Patient Strengths: Ability for insight Average or above average intelligence Communication skills  Treatment Modalities: Medication Management, Group therapy, Case management,  1 to 1 session with clinician, Psychoeducation, Recreational therapy.   Physician Treatment Plan for Primary Diagnosis: <principal problem not specified> Long Term Goal(s):     Short Term Goals:    Medication Management: Evaluate patient's response, side effects, and tolerance of medication regimen.  Therapeutic Interventions: 1 to 1 sessions, Unit Group sessions and Medication administration.  Evaluation of Outcomes: Not Met  Physician Treatment Plan for Secondary Diagnosis: Active Problems:   Bipolar disorder ([place])  Long Term Goal(s):     Short Term Goals:       Medication Management: Evaluate patient's response, side effects, and tolerance of medication regimen.  Therapeutic Interventions: 1 to 1 sessions, Unit Group sessions and Medication administration.  Evaluation of Outcomes: Not Met   RN Treatment Plan for Primary Diagnosis: <principal problem not specified> Long Term Goal(s): Knowledge of disease and therapeutic regimen to maintain health will improve  Short Term Goals: Ability to verbalize feelings will improve, Ability to disclose and discuss suicidal ideas and Compliance with prescribed medications will  improve  Medication Management: RN will administer medications as ordered by provider, will assess and evaluate patient's response and provide education to patient for prescribed medication. RN will report any adverse and/or side effects to prescribing provider.  Therapeutic Interventions: 1 on 1 counseling sessions, Psychoeducation, Medication administration, Evaluate responses to treatment, Monitor vital signs and CBGs as ordered, Perform/monitor CIWA, COWS, AIMS and Fall Risk screenings as ordered, Perform wound care treatments as  ordered.  Evaluation of Outcomes: Not Met   LCSW Treatment Plan for Primary Diagnosis: <principal problem not specified> Long Term Goal(s): Safe transition to appropriate next level of care at discharge, Engage patient in therapeutic group addressing interpersonal concerns.  Short Term Goals: Engage patient in aftercare planning with referrals and resources and Increase skills for wellness and recovery  Therapeutic Interventions: Assess for all discharge needs, 1 to 1 time with Social worker, Explore available resources and support systems, Assess for adequacy in community support network, Educate family and significant other(s) on suicide prevention, Complete Psychosocial Assessment, Interpersonal group therapy.  Evaluation of Outcomes: Not Met   Progress in Treatment: Attending groups: No. Participating in groups: No. Taking medication as prescribed: Yes. Toleration medication: Yes. Family/Significant other contact made: No, will contact:  when given permission Patient understands diagnosis: Yes. Discussing patient identified problems/goals with staff: Yes. Medical problems stabilized or resolved: Yes. Denies suicidal/homicidal ideation: Yes. Issues/concerns per patient self-inventory: No. Other: none  New problem(s) identified: No, Describe:  none  New Short Term/Long Term Goal(s):Pt goal: get better medication that will help, better coping skills.  Discharge Plan or Barriers: CSW assessing for appropriate plan.  Reason for Continuation of Hospitalization: Depression Medication stabilization  Estimated Length of Stay: 3-5 days.  Recreational Therapy: Patient Stressors: Family, Relationship  Patient states that she tried to hurt herself because she felt depressed. Patient Goal: Patient will be able to identify at least 5 coping skills for depression by conclusion of recreation therapy tx   Attendees: Patient:Lindsey Casey   Physician: Dr Wonda Olds 01/16/17   Nursing: Elige Radon, RN 01/16/17  RN Care Manager: 01/16/17   Social Worker: Lurline Idol, LCSW 01/16/17  Recreational Therapist: Roanna Epley, LRT, CTRS 01/16/17  Other:    Other:    Other:        Scribe for Treatment Team: Joanne Chars, LCSW 01/16/2017 12:04 PM

## 2017-01-16 NOTE — Progress Notes (Signed)
Denies SI/HI,/AVH. Support and encouragement offered.  Safety maintained.

## 2017-01-16 NOTE — Plan of Care (Signed)
Problem: Coping: Goal: Ability to identify and develop effective coping behavior will improve Outcome: Progressing Reading and talking to staff

## 2017-01-16 NOTE — Progress Notes (Signed)
Patient has been in bed sleeping. No sign of distress noted. Safety precautions maintained.

## 2017-01-16 NOTE — BHH Group Notes (Signed)
B[facility] Group Notes:  (Nursing/MHT/Case Management/Adjunct)  Date:  01/16/2017  Time:  4:47 AM  Type of Therapy:  Psychoeducational Skills  Participation Level:  Did Not Attend  Summary of Progress/Problems:  Lindsey Casey 01/16/2017, 4:47 AM

## 2017-01-16 NOTE — Plan of Care (Signed)
Problem: Education: Goal: Ability to state activities that reduce stress will improve Outcome: Progressing  Reports that she likes to read. Has been reading in room. Currently attending room
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
## 2017-01-17 MED ORDER — INFLUENZA VAC SPLIT QUAD 0.5 ML IM SUSY
0.5000 mL | PREFILLED_SYRINGE | INTRAMUSCULAR | Status: AC
  Administered 2017-01-18: 0.5 mL via INTRAMUSCULAR
  Filled 2017-01-17: qty 0.5
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
## 2017-01-17 NOTE — Plan of Care (Signed)
Problem: Medication: Goal: Compliance with prescribed medication regimen will improve Outcome: Progressing Medication compliant   

## 2017-01-17 NOTE — [facility] Group Notes (Signed)
[facility] Group Notes:  (Nursing/MHT/Case Management/Adjunct)  Date:  01/17/2017  Time:  5:07 AM  Type of Therapy:  Psychoeducational Skills  Participation Level:  Active  Participation Quality:  Appropriate and Attentive  Affect:  Appropriate and Flat  Cognitive:  Appropriate  Insight:  Appropriate  Engagement in Group:  Engaged  Modes of Intervention:  Discussion, Socialization and Support  Summary of Progress/Problems:  Lindsey MilroyLaquanda Y Efrem Casey 01/17/2017, 5:07 AM

## 2017-01-17 NOTE — [facility] Group Notes (Signed)
[facility] LCSW Group Therapy 01/17/2017 1:15pm  Type of Therapy: Group Therapy- Feelings Around Discharge & Establishing a Supportive Framework  Participation Level:  ACTIVE Description of Group:   What is a supportive framework? What does it look like feel like and how do I discern it from and unhealthy non-supportive network? Learn how to cope when supports are not helpful and don't support you. Discuss what to do when your family/friends are not supportive.  Summary of Patient Progress; Patient actively participated throughout the group discussion. She reports feeling well, better than yesterday. Patient reports that yesterday she experienced a "bunch of low moments". Patient described her support system to be people that are willing to do things with her like her mom and her brother.    Therapeutic Modalities:   Cognitive Behavioral Therapy Person-Centered Therapy Motivational Interviewing   Alfonzia Woolum  CUEBAS-COLON, LCSWA

## 2017-01-17 NOTE — [facility] Group Notes (Signed)
[facility] Group Notes:  (Nursing/MHT/Case Management/Adjunct)  Date:  01/17/2017  Time:  11:14 PM  Type of Therapy:  Psychoeducational Skills  Participation Level:  Active  Participation Quality:  Appropriate, Attentive and Sharing  Affect:  Appropriate  Cognitive:  Appropriate  Insight:  Appropriate and Good  Engagement in Group:  Engaged  Modes of Intervention:  Discussion, Socialization and Support  Summary of Progress/Problems:  Chancy MilroyLaquanda Y Aviana Shevlin 01/17/2017, 11:14 PM

## 2017-01-17 NOTE — Progress Notes (Signed)
Pleasant and cooperative.  Soft spoken.  Reserved.  Denies SI/HI/AVH.  Support offered.  Safety maintained.

## 2017-01-17 NOTE — Progress Notes (Signed)
Pleasant and smiling on contact. States she feels much better. States she drank rat poison and thinks that her drinking caused her to do it. Endorses depression rated 3/10 and anxiety 4/10 stating the anxiety is because she heard her brother is here because he is detoxing from alcohol.Denies thoughts of self harm, HI, or AVH.  Was visible in the milieu and selectively social. Atarax given at hs for sleep. No physical complaints . Remains on routine obs for safety

## 2017-01-18 NOTE — Plan of Care (Signed)
  Progressing Education: Ability to state activities that reduce stress will improve 01/18/2017 1135 - Progressing by Lannie Yusuf L, RN Coping: Ability to identify and develop effective coping behavior will improve 01/18/2017 1135 - Progressing by Keymora Grillot L, RN Coping: Ability to cope will improve 01/18/2017 1135 - Progressing by Czar Ysaguirre L, RN Ability to verbalize feelings will improve 01/18/2017 1135 - Progressing by Cezar Misiaszek L, RN Education: Ability to incorporate positive changes in behavior to improve self-esteem will improve 01/18/2017 1135 - Progressing by Donte Lenzo L, RN Coping: Ability to cope will improve 01/18/2017 1135 - Progressing by Vontrell Pullman L, RN Medication: Compliance with prescribed medication regimen will improve 01/18/2017 1135 - Progressing by Adonia Porada L, RN Safety: Ability to remain free from injury will improve 01/18/2017 1135 - Progressing by Harveen Flesch, Jim Desanctisyawn L, RN

## 2017-01-18 NOTE — Progress Notes (Signed)
Influenza vaccine administered in L) deltoid. Education provided to patient prior to administration. No complaints of voiced.

## 2017-01-18 NOTE — [facility] Group Notes (Signed)
01/18/2017 1:15pm  Type of Therapy/Topic:  Group Therapy:  Balance in Life  Participation Level:  Pt invited to group, did not attend    Therapeutic Modalities:   Cognitive Behavioral Therapy Solution-Focused Therapy Assertiveness Training  Joslynn Jamroz  CUEBAS-COLON, LCSW-A 01/18/2017 2:54 PM
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
## 2017-01-19 MED ORDER — NALTREXONE HCL 50 MG PO TABS
50.0000 mg | ORAL_TABLET | Freq: Every day | ORAL | Status: DC
  Filled 2017-01-19: qty 1
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
## 2017-01-19 NOTE — Progress Notes (Signed)
Patient pleasant and cooperative in the unit.Denies SI,HI and AVH.Visible in the milieu.Compliant with medications.Attended groups.Appetite and energy level good.Support and encouragement given.

## 2017-01-19 NOTE — Progress Notes (Signed)
Recreation Therapy Notes  Date: 11.05.18  Time: 1:00 pm   Location: Craft Room  Behavioral response: Attentive, Appropriate   Intervention Topic: Problem solving   Discussion/Intervention: Group content on today was focused on problem solving. The group described what problem solving is. Patients expressed how problems affect them and how they deal with problems. Individuals identified healthy ways to deal with problems. Patients explained what normally happens to them when they do not deal with problems. The group expressed reoccurring problems for them. The group participated in the intervention "Put the story together" with their peers and worked together to put a story that was broken up in the correct order.   Clinical Observations/Feedback:  Patient came to group and was focused on what her peers and staff had to say about the topic at hand. Individual participated in the intervention and was pleasant with her peers and staff.    Montravious Weigelt LRT/CTRS         [place] 01/19/2017 2:24 PM

## 2017-01-19 NOTE — Plan of Care (Signed)
Patient is pleasant and cooperative on approach.Verbalized her feelings with staff.

## 2017-01-19 NOTE — Plan of Care (Signed)
Remained calm and cooperative. Denied thoughts of self harm. Alert and oriented. No sign of discomfort.

## 2017-01-19 NOTE — Progress Notes (Signed)
Recreation Therapy Notes  Date: 11.05.18  Time: 3:00pm  Location: Craft room  Behavioral response: Appropriate  Group Type: Art/Craft,Game  Participation level: Active  Communication: Patient was social with peers and staff.  Comments: N/A  Latish Toutant LRT/CTRS        Bertram Haddix 01/19/2017 4:16 PM

## 2017-01-19 NOTE — Progress Notes (Signed)
Patient had a great shift: stayed in the milieu but tends to stay alone. Pleasant and cooperative. Denied suicidal thoughts. Went to her room and  read a book. Did not complain of anything. Patient took medications and had a snack then went to bed. Slept throughout the night. Currently in bed awake. No sign of discomfort. Staff continue to monitor.
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
## 2017-01-20 LAB — LITHIUM LEVEL: Lithium Lvl: 0.43 mmol/L — ABNORMAL LOW (ref 0.60–1.20)

## 2017-01-20 MED ORDER — LEVETIRACETAM 500 MG PO TABS: 500.0000 mg | ORAL_TABLET | Freq: Two times a day (BID) | ORAL | 1 refills | Status: DC

## 2017-01-20 MED ORDER — GABAPENTIN 300 MG PO CAPS: 300.0000 mg | ORAL_CAPSULE | Freq: Three times a day (TID) | ORAL | 1 refills | Status: DC

## 2017-01-20 MED ORDER — LITHIUM CARBONATE ER 300 MG PO TBCR: 300.0000 mg | EXTENDED_RELEASE_TABLET | Freq: Two times a day (BID) | ORAL | 1 refills | Status: AC

## 2017-01-20 MED ORDER — DIPHENHYDRAMINE HCL 25 MG PO CAPS
50.0000 mg | ORAL_CAPSULE | Freq: Four times a day (QID) | ORAL | Status: DC | PRN
  Administered 2017-01-20 – 2017-01-21 (×3): 50 mg via ORAL
  Filled 2017-01-20 (×3): qty 2

## 2017-01-20 MED ORDER — GABAPENTIN 600 MG PO TABS
300.0000 mg | ORAL_TABLET | Freq: Three times a day (TID) | ORAL | Status: DC
  Administered 2017-01-20 – 2017-01-21 (×3): 300 mg via ORAL
  Filled 2017-01-20 (×3): qty 1

## 2017-01-20 MED ORDER — LITHIUM CARBONATE ER 300 MG PO TBCR: 300.0000 mg | EXTENDED_RELEASE_TABLET | Freq: Two times a day (BID) | ORAL | 1 refills | Status: DC

## 2017-01-20 MED ORDER — LEVETIRACETAM 500 MG PO TABS: 500.0000 mg | ORAL_TABLET | Freq: Two times a day (BID) | ORAL | 1 refills | Status: AC

## 2017-01-20 MED ORDER — GABAPENTIN 300 MG PO CAPS: 300.0000 mg | ORAL_CAPSULE | Freq: Three times a day (TID) | ORAL | 1 refills | Status: AC
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
## 2017-01-20 NOTE — Progress Notes (Signed)
Lindsey MuirJamie was in room at the beginning of this shift. Reported to this writer that the medication (Neurontin) is making her drowsy. Was out to the dayroom for group. Stayed there after group, talking to peers. Maintained a positive attitude. Denying thoughts of self harm. Compliant with treatment plan. Patient  is able to express her feelings in appropriate manner. No distress noted. Patient received her bedtime medication, had a snack and currently in bed resting. Safety precautions maintained.

## 2017-01-20 NOTE — [facility] Group Notes (Signed)
[facility] Group Notes:  (Nursing/MHT/Case Management/Adjunct)  Date:  01/20/2017  Time:  2:19 PM  Type of Therapy:  Psychoeducational Skills  Participation Level:  Active  Participation Quality:  Appropriate  Affect:  Appropriate  Cognitive:  Appropriate  Insight:  Appropriate  Engagement in Group:  Engaged  Modes of Intervention:  Discussion and Education  Summary of Progress/Problems:  Mickey Farberamela M Moses Ellison 01/20/2017, 2:19 PM

## 2017-01-20 NOTE — Progress Notes (Signed)
Recreation Therapy Notes  Date: 11.06.18  Time: 3:00pm  Location: Craft room  Behavioral response: Appropriate  Group Type: Art/Craft  Participation level: Active  Communication: Patient was social with peers and staff.  Comments: N/A  Lindsey Casey LRT/CTRS        Lindsey Casey 01/20/2017 4:42 PM

## 2017-01-20 NOTE — Progress Notes (Signed)
Recreation Therapy Notes  Date: 11.06.18  Time: 2:00pm  Location: Craft Room  Behavioral response: Appropriate  Intervention Topic: Anger Management  Discussion/Intervention: Group content on today was focused on anger management. The group defined anger and reasons they become angry. Individuals expressed negative way they have dealt with anger in the past. Patients stated some positive ways they could deal with anger in the future. The group described how anger can affect your health and daily plans. Individuals participated in the intervention "Score your anger" where they had a chance to answer questions about themselves and get a score of their anger.   Patient came to group and expressed that she normally knows when she is angry because she breaks out in hives. She participated in the intervention and was positive with peers and staff during group.   Allye Hoyos LRT/CTRS     Rashad Obeid 01/20/2017 4:25 PM

## 2017-01-20 NOTE — Progress Notes (Signed)
D- Patient alert and oriented. Patient is pleasant on the unit and expresses eagerness to be discharged tomorrow. Patient denies SI/HI/AVH at this time. Patient reports that her depression is at a "2/10" and her anxiety is at a "4/10". Patient states that her goal is to "work on her irritability by not lashing out at people". Patient doesn't have any questions or concerns at this time.  A- Scheduled medications administered to patient, per MD orders. Support and encouragement provided.  Routine safety checks conducted every 15 minutes.  Patient informed to notify staff with problems or concerns.  R- No adverse drug reactions noted. Patient contracts for safety at this time. Patient compliant with medications and treatment plan. Patient receptive, calm, and cooperative. Patient interacts well with others on the unit.  Patient remains safe at this time.

## 2017-01-20 NOTE — [facility] Group Notes (Signed)
[facility] Group Notes:  (Nursing/MHT/Case Management/Adjunct)  Date:  01/20/2017  Time:  11:44 PM  Type of Therapy:  Group Therapy  Participation Level:  Active  Participation Quality:  Appropriate  Affect:  Appropriate  Cognitive:  Alert  Insight:  Good  Engagement in Group:  Engaged  Modes of Intervention:  Support  Summary of Progress/Problems:  Lindsey Casey 01/20/2017, 11:44 PM

## 2017-01-20 NOTE — Plan of Care (Signed)
D: Patient states that she is feeling "happy" today and that she is "ready to go home". Patient states that she has a "really good" support system at home and states that she will utilize her support system by talking to them during times of distress. Patient rates her depression as a "2/10" and her anxiety as a "4/10". Patient has been compliant with her medication regimen and understands what she is taking with no questions or concerns at this time. Patient has maintained a positive attitude and outlook when it comes to expressing herself and has been participating well with others in group seeions as well as on the unit.
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
## 2017-01-20 NOTE — [facility] Group Notes (Signed)
[facility] Group Notes:  (Nursing/MHT/Case Management/Adjunct)  Date:  01/20/2017  Time:  6:02 AM  Type of Therapy:  Psychoeducational Skills  Participation Level:  Active  Participation Quality:  Appropriate, Attentive and Sharing  Affect:  Appropriate  Cognitive:  Appropriate  Insight:  Appropriate and Good  Engagement in Group:  Engaged  Modes of Intervention:  Discussion, Socialization and Support  Summary of Progress/Problems:  Chancy MilroyLaquanda Y Maripat Borba 01/20/2017, 6:02 AM

## 2017-01-20 NOTE — Plan of Care (Signed)
Active in groups and other activities, able to discuss her feelings, compliant with medications

## 2017-01-21 NOTE — Progress Notes (Signed)
Bright affect.  Denies SI/HI/AVH.  Verbalizes that she is excited to be going home.  Discharge instructions given, verbalized understanding.  Prescriptions, seven day supply of medications, doctors note as well as coupons for medications given.  Personal belongings returned.  Escorted off unit by this Clinical research associatewriter to meet father to travel home.

## 2017-01-21 NOTE — Progress Notes (Signed)
D: Pt denies SI/HI/AVH, affect is flat and sad but brightens upon approach. Pt is pleasant and cooperative with staff and towards her treatment plan. Pt appears less anxious and she was noted in the dayroom interacting with peers appropriately.  A: Pt was offered support and encouragement. Pt was given scheduled medications and   encouraged to attend groups. 15 minute checks were done for safety.  R: Pt attends groups and interacts well with peers and staff. Pt is complaint with medication and receptive to treatment. Safety maintained on unit, will continue to monitor.

## 2017-01-21 NOTE — [facility] Suicide Risk Assessment (Signed)
[facility]   Principal Problem: Unspecified mood (affective) disorder [facility](HCC) Discharge Diagnoses:  Patient Active Problem List   Diagnosis Date Noted  . Unspecified mood (affective) disorder (HCC) [F39] 01/16/2017  . Bipolar disorder (HCC) [F31.9] 01/15/2017  . Leukocytosis [D72.829] 02/03/2016  . Seizure (HCC) [R56.9] 02/03/2016  . Anxiety [F41.9] 02/03/2016  . Asthma [J45.909] 02/03/2016  . Benzodiazepine withdrawal with complication [facility](HCC) [F13.239] 02/03/2016    Total Time spent with patient: 20 minutes  Musculoskeletal: Strength & Muscle Tone: within normal limits Gait & Station: normal Patient leans: N/A  Psychiatric Specialty Exam: ROS  Blood pressure (!) 101/59, pulse (!) 53, temperature 98.4 F (36.9 C), temperature source Oral, resp. rate 16, height 5\' 2"  (1.575 m), weight 54.4 kg (120 lb), last menstrual period 01/10/2017, SpO2 100 %.Body mass index is 21.95 kg/m.   Demographic Factors:  NA  Loss Factors: Loss of significant relationship  Historical Factors: Prior suicide attempts, Family history of mental illness or substance abuse and Impulsivity  Risk Reduction Factors:   Sense of responsibility to family, Positive social support, Positive therapeutic relationship and Positive coping skills or problem solving skills  Continued Clinical Symptoms:  none  Cognitive Features That Contribute To Risk:  None    Suicide Risk:  Minimal: No identifiable suicidal ideation.  Patients presenting with no risk factors but with morbid ruminations; may be classified as minimal risk based on the severity of the depressive symptoms  Follow-up Information    [facility] Follow up.   Why:  Currently declining aftercare referrals, however you may walk in to [facility] for an assessment and referral for medications management and therapy.  Bring hospital discharge paperwork, pay stubs and photo ID with you.   Contact information: [address] [place] [address] [phone]           Plan Of Care/Follow-up recommendations:  Follow up with [facility]  Haskell RilingHolly R Kadesia Robel, MD 01/21/2017, 8:20 AM

## 2017-01-21 NOTE — Progress Notes (Signed)
  [facility] :  Will you be returning to the same living situation after discharge:  Yes,    At discharge, do you have transportation home?: Yes,    Do you have the ability to pay for your medications: Yes,     Release of information consent forms completed and in the chart;  Patient's signature needed at discharge.  Patient to Follow up at: Follow-up Information    [facility], [facility]. Go on 01/27/2017.   Why:  2:40pm for Hospital follow up.  Please take photo ID and discharge paperwork with you to this appointment.  Contact information: [address] [place] [address] [phone]           Next level of care provider [facility] Link:no  Safety Planning and Suicide Prevention discussed: Yes,        Has patient been referred to the Quitline?: Patient refused referral  Patient has been referred for addiction treatment: Yes  Glennon MacSara P Joannah Gitlin, LCSW 01/21/2017, 3:16 PM

## 2017-01-21 NOTE — Discharge Summary (Addendum)
Physician Discharge Summary Note  Patient:  Lindsey Casey is an 21 y.o., female MRN:  [id number] DOB:  1995-10-25 Patient phone:  [phone] (home)  Patient address:   [address] [place] [address],  Total Time spent with patient: 20 minutes  Plus 20 minutes of medication reconciliation, discharge planning and discharge documentation  Date of Admission:  01/15/2017 Date of Discharge: 01/21/17  Reason for Admission:  Suicide attempt by ingesting rat poison  Principal Problem: Unspecified mood (affective) disorder [facility]) Discharge Diagnoses: Patient Active Problem List   Diagnosis Date Noted  . Unspecified mood (affective) disorder (HCC) [F39] 01/16/2017  . Bipolar disorder (HCC) [F31.9] 01/15/2017  . Leukocytosis [D72.829] 02/03/2016  . Seizure (HCC) [R56.9] 02/03/2016  . Anxiety [F41.9] 02/03/2016  . Asthma [J45.909] 02/03/2016  . Benzodiazepine withdrawal with complication [facility]) [F13.239] 02/03/2016    Past Psychiatric History: See H&P  Past Medical History:  Past Medical History:  Diagnosis Date  . Anxiety   . Asthma   . Seizures (HCC)    History reviewed. No pertinent surgical history. Family History: History reviewed. No pertinent family history. Family Psychiatric  History: See H&P Social History:  Social History   Substance and Sexual Activity  Alcohol Use No     Social History   Substance and Sexual Activity  Drug Use Yes  . Frequency: 3.0 times per week  . Types: Marijuana    Social History   Socioeconomic History  . Marital status: Single    Spouse name: None  . Number of children: None  . Years of education: None  . Highest education level: None  Social Needs  . Financial resource strain: None  . Food insecurity - worry: None  . Food insecurity - inability: None  . Transportation needs - medical: None  . Transportation needs - non-medical: None  Occupational History  . None  Tobacco Use  . Smoking status: Never Smoker  . Smokeless  tobacco: Never Used  Substance and Sexual Activity  . Alcohol use: No  . Drug use: Yes    Frequency: 3.0 times per week    Types: Marijuana  . Sexual activity: No  Other Topics Concern  . None  Social History Narrative  . None    Hospital Course:  Pt was admitted for suicide attempt by ingesting rat poison while intoxicated. In speaking with her mother, it appeared that her diagnosis most closely resembled borderline Personality disorder. She was started on Lithium to help with frequent mood swings and suicidal thoughts. Lithium level was 0.43. Although, this level is subtherapeutic she had significant improvement in her mod on this dose. She tolerated this well and felt it was very helpful. She was started on Gabapentin 600 mg TID for benzo/alcohol w/d prophylaxis and this was eventually decreased to 300 mg TID because it was making her sedated. She felt that this was helpful for anxiety and alcohol cravings so plan is to continue this outpatient. We did discuss naltrexone as an option for alcohol cravings however, she did not feel she would be able afford it at this time due to having no insurance and would like to continue with just gabapentin at this time. Encouraged her to discuss Naltrexone  with her outpatient providers when she gets insurance. She was also restarted on Keppra which she was on at home.  On day of discharge, pt was excited to discharge. When asked, she denied SI or any thoughts of cutting or self harm. She denied AH, VH. She requests  a letter to return to work which was given to her. She plans to go to an AA meeting today with her mother. I did print some coupons out for her to help reduce the cost of her medications. Pt was highly encouraged to pursue DBT when insurance is reinstated and she agrees to this.  The patient is at low risk of imminent suicide. Patient denied thoughts, intent, or plan for harm to self or others, expressed significant future orientation, and expressed  an ability to mobilize assistance for his/her needs. She is presently void of any contributing psychiatric symptoms, cognitive difficulties, or substance use which would elevate her risk for lethality. Chronic risk for lethality is elevated in light of nature of borderline personality disorder, poor coping skills, history of impulsive suicide attempts in response to stressors or feelings of abandonment .The chronic risk is presently mitigated by her [facility] and mobilization of support from family and friends. Chronic risk may elevate if she experiences any significant loss or worsening of symptoms, which can be managed and monitored through outpatient providers. At this time, acute risk for lethality is low and he/she is stable for ongoing outpatient management.   Modifiable risk factors were addressed during this hospitalization through appropriate pharmacotherapy and establishment of outpatient follow-up treatment. Some risk factors for suicide are  related personality pathology (i.e. Poor coping mechanisms) and thus cannot be further mitigated by continued hospitalization in this setting.   The patient demonstrates the following risk factors for suicide: Chronic risk factors for suicide include: psychiatric disorder of borderline personality disorder, previous suicide attempts multiple and previous self-harm history of cutting. Acute risk factors for suicide include: recent discharge from inpatient psychiatry. Protective factors for this patient include: positive social support, positive therapeutic relationship, hope for the future and life satisfaction. Considering these factors, the overall suicide risk at this point appears to be low. Patient is appropriate for outpatient follow up.   Musculoskeletal: Strength & Muscle Tone: within normal limits Gait & Station: normal Patient leans: N/A  Psychiatric Specialty Exam: Physical Exam  Nursing note and vitals  reviewed.   Review of Systems  All other systems reviewed and are negative.   Blood pressure (!) 101/59, pulse (!) 53, temperature 98.4 F (36.9 C), temperature source Oral, resp. rate 16, height 5\' 2"  (1.575 m), weight 54.4 kg (120 lb), last menstrual period 01/10/2017, SpO2 100 %.Body mass index is 21.95 kg/m.  General Appearance: Casual  Eye Contact:  Good  Speech:  Clear and Coherent  Volume:  Normal  Mood:  Euthymic  Affect:  Congruent  Thought Process:  Coherent and Goal Directed  Orientation:  Full (Time, Place, and Person)  Thought Content:  NA  Suicidal Thoughts:  No  Homicidal Thoughts:  No  Memory:  Immediate;   Fair  Judgement:  Fair  Insight:  Fair  Psychomotor Activity:  Normal  Concentration:  Concentration: Fair  Recall:  [employer]  Fund of Knowledge:  Fair  Language:  Fair  Akathisia:  No      Assets:  Communication Skills Desire for Improvement Housing Social Support  ADL's:  Intact  Cognition:  WNL  Sleep:  Number of Hours: 7.5        Has this patient used any form of tobacco in the last 30 days? (Cigarettes, Smokeless Tobacco, Cigars, and/or Pipes) Yes, Yes, A prescription for an FDA-approved tobacco cessation medication was offered at discharge and the patient refused  Blood Alcohol level:  Lab Results  Component Value Date   ETH 276 (H) 01/14/2017   ETH <5 09/14/2016    Metabolic Disorder Labs:  Lab Results  Component Value Date   HGBA1C 5.1 01/16/2017   MPG 99.67 01/16/2017   No results found for: PROLACTIN Lab Results  Component Value Date   CHOL 176 01/16/2017   TRIG 135 01/16/2017   HDL 50 01/16/2017   CHOLHDL 3.5 01/16/2017   VLDL 27 01/16/2017   LDLCALC 99 01/16/2017    See Psychiatric Specialty Exam and Suicide Risk Assessment completed by Attending Physician prior to discharge.  Discharge destination:  Home  Is patient on multiple antipsychotic therapies at discharge:  No   Has Patient had three or more failed trials of  antipsychotic monotherapy by history:  No  Recommended Plan for Multiple Antipsychotic Therapies: NA  Discharge Instructions    Increase activity slowly   Complete by:  As directed      Allergies as of 01/21/2017      Reactions   Other    Heat - leads to hives   Focalin [dexmethylphenidate Hydrochloride] Rash   Major rash with blistering      Medication List    TAKE these medications     Indication  gabapentin 300 MG capsule Commonly known as:  NEURONTIN Take 1 capsule (300 mg total) 3 (three) times daily by mouth.  Indication:  anxiety, alcohol cravings   levETIRAcetam 500 MG tablet Commonly known as:  KEPPRA Take 1 tablet (500 mg total) 2 (two) times daily by mouth.  Indication:  Seizure   lithium carbonate 300 MG CR tablet Commonly known as:  LITHOBID Take 1 tablet (300 mg total) every 12 (twelve) hours by mouth.  Indication:  Personality Disorder   loratadine 10 MG tablet Commonly known as:  CLARITIN Take 10 mg by mouth daily.  Indication:  Hayfever      Follow-up Information    Services, [facility]. Go on 01/27/2017.   Why:  2:40pm for Hospital follow up.  Please take photo ID and discharge paperwork with you to this appointment.  Contact information: [address] [place] [phone]           Follow-up recommendations:  Follow up with [facility] Signed: Haskell RilingHolly R Eilish Mcdaniel, MD 01/21/2017, 8:12 AM

## 2017-01-21 NOTE — Progress Notes (Signed)
Recreation Therapy Notes  INPATIENT RECREATION TR PLAN  Patient Details Name: Lindsey Casey MRN: [id number] DOB: 11/05/1995 Today's Date: 01/21/2017  Rec Therapy Plan Is patient appropriate for Therapeutic Recreation?: Yes Treatment times per week: At least 3 Estimated Length of Stay: 5-7 days TR Treatment/Interventions: Group participation (Comment)(Appropriate participation in recreation therapy tx.)  Discharge Criteria Pt will be discharged from therapy if:: Discharged Treatment plan/goals/alternatives discussed and agreed upon by:: Patient/family  Discharge Summary Short term goals set: Patient will be able to identify at least 5 coping skills for depression by conclusion of recreation therapy tx. Short term goals met: Adequate for discharge Progress toward goals comments: Groups attended Which groups?: Anger management(Problem Solving) Reason goals not met: N/A Therapeutic equipment acquired: N/A Reason patient discharged from therapy: Discharge from hospital Pt/family agrees with progress & goals achieved: Yes Date patient discharged from therapy: 01/21/17   Lerin Jech 01/21/2017, 4:36 PM

## 2017-01-21 NOTE — Plan of Care (Signed)
Patient was noted interacting with peers denies anxiety.

## 2017-07-08 IMAGING — DX DG ANKLE 2V *R*
2 series · 2 of 2 positions shown · non-contrast
Comparison: None.

CLINICAL DATA: Status post jump off bridge, with right ankle
bruising and lacerations. Initial encounter.

EXAM:
RIGHT ANKLE - 2 VIEW

[ankle ap]
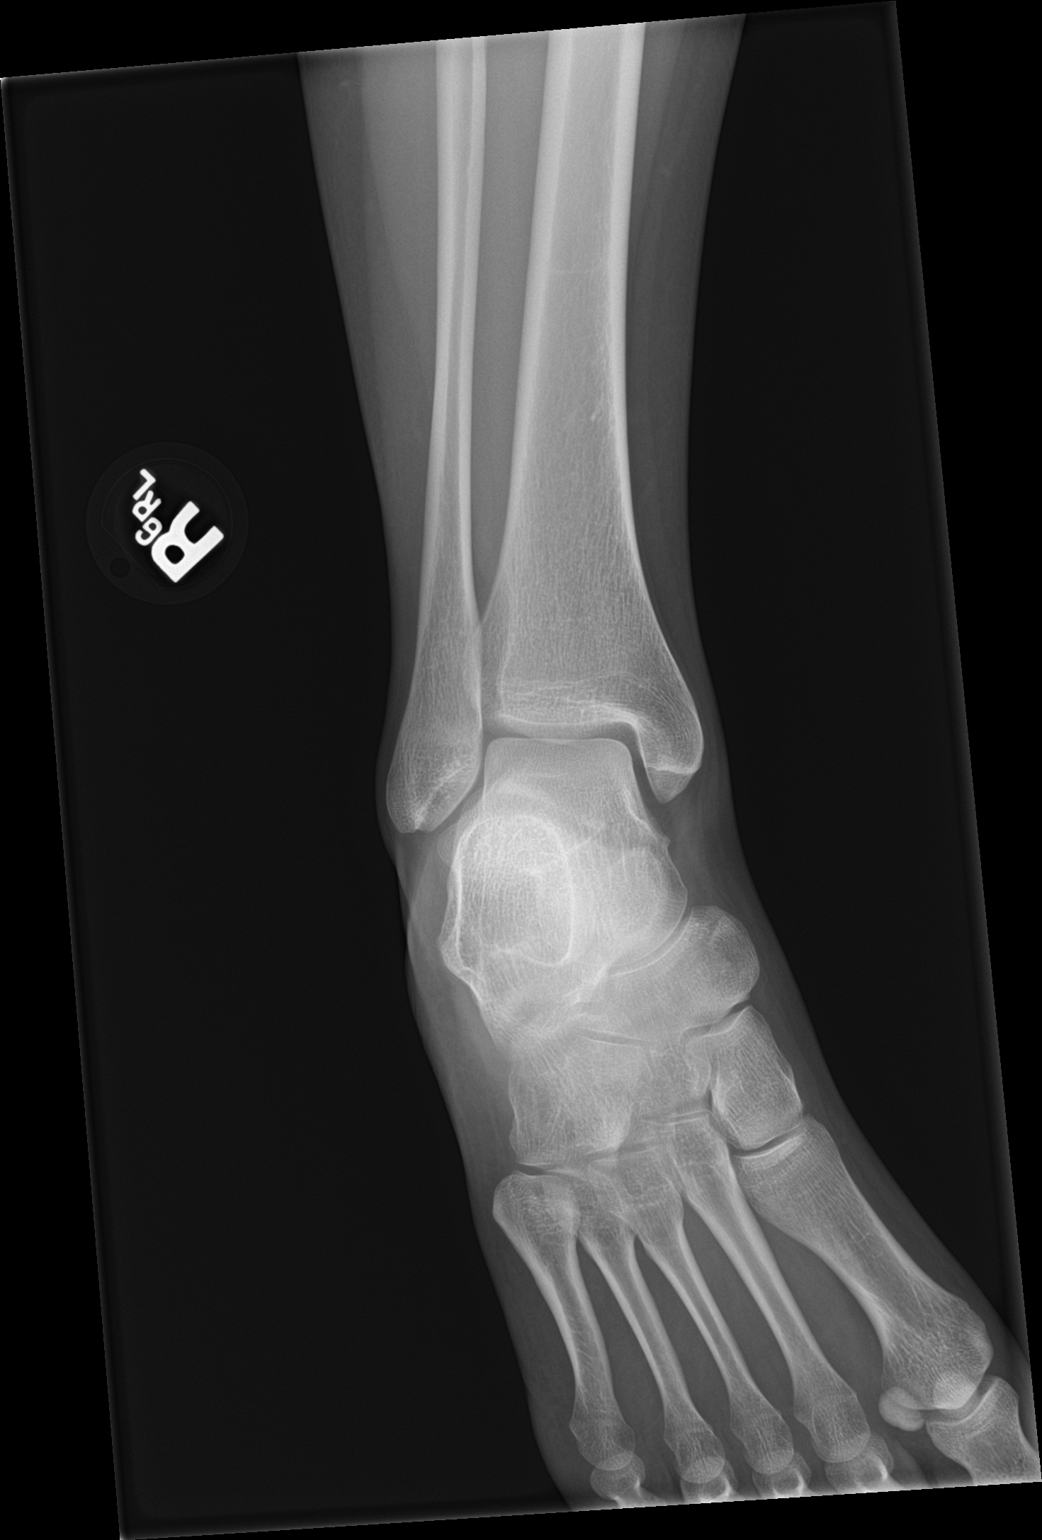

[ankle lat]
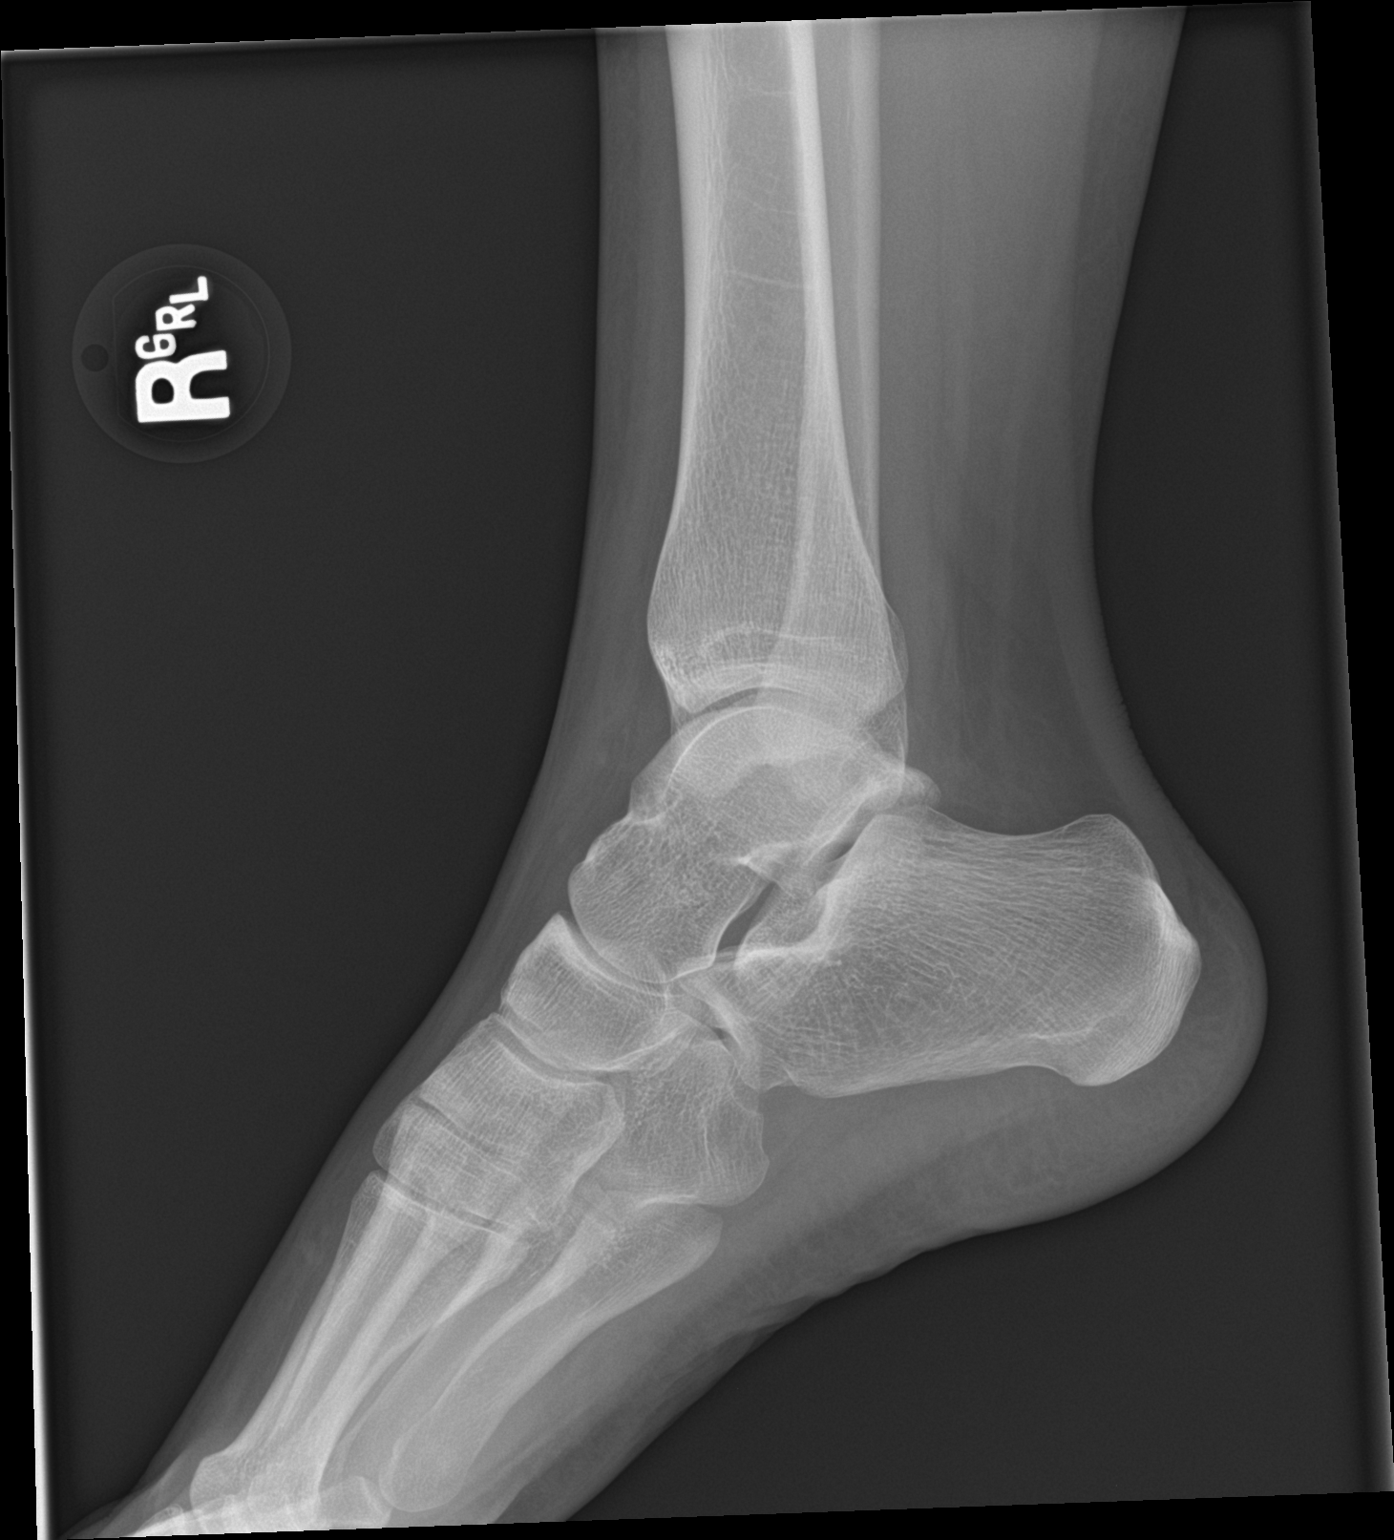

[2 of 2 positions shown; findings below may reference images not displayed]

FINDINGS: There is no evidence of fracture or dislocation. The ankle mortise
is intact; the interosseous space is within normal limits. No talar
tilt or subluxation is seen.

The joint spaces are preserved. No significant soft tissue
abnormalities are seen.
IMPRESSION: No evidence of fracture or dislocation.

## 2017-09-14 DEATH — deceased
